# Patient Record
Sex: Female | Born: 2000 | Race: White | Hispanic: No | Marital: Single | Smoking: Never smoker
Health system: Southern US, Community
[De-identification: ages and names within clinical notes are randomized; demographics above are authoritative.]

## PROBLEM LIST (undated history)

## (undated) DIAGNOSIS — F419 Anxiety disorder, unspecified: Secondary | ICD-10-CM

## (undated) DIAGNOSIS — J020 Streptococcal pharyngitis: Secondary | ICD-10-CM

---

## 2019-01-03 ENCOUNTER — Other Ambulatory Visit: Payer: Self-pay

## 2019-01-03 ENCOUNTER — Emergency Department: Payer: BLUE CROSS/BLUE SHIELD

## 2019-01-03 ENCOUNTER — Emergency Department
Admission: EM | Admit: 2019-01-03 | Discharge: 2019-01-03 | Discharge status: Against Medical Advice | Payer: BLUE CROSS/BLUE SHIELD | Attending: Emergency Medicine | Admitting: Emergency Medicine

## 2019-01-03 DIAGNOSIS — Z5321 Procedure and treatment not carried out due to patient leaving prior to being seen by health care provider: Secondary | ICD-10-CM | POA: Diagnosis not present

## 2019-01-03 DIAGNOSIS — R0602 Shortness of breath: Secondary | ICD-10-CM | POA: Insufficient documentation

## 2019-01-03 NOTE — ED Triage Notes (Signed)
Patient reports feeling short of breath and like throat constricting.  Patient is able to speak in complete sentences at this time.

## 2019-01-03 NOTE — ED Notes (Signed)
Patient sitting in lobby in no acute distress.  

## 2019-01-03 NOTE — ED Notes (Signed)
Visitor to stat desk asking about wait time. Visitor given update on wait time. Visitor verbalizes understanding.  

## 2020-09-19 ENCOUNTER — Ambulatory Visit
Admission: EM | Admit: 2020-09-19 | Discharge: 2020-09-19 | Disposition: A | Discharge status: Home / Self Care | Payer: BC Managed Care – PPO | Attending: Family Medicine | Admitting: Family Medicine

## 2020-09-19 DIAGNOSIS — R519 Headache, unspecified: Secondary | ICD-10-CM | POA: Diagnosis present

## 2020-09-19 DIAGNOSIS — J029 Acute pharyngitis, unspecified: Secondary | ICD-10-CM | POA: Insufficient documentation

## 2020-09-19 DIAGNOSIS — J039 Acute tonsillitis, unspecified: Secondary | ICD-10-CM | POA: Diagnosis present

## 2020-09-19 DIAGNOSIS — R42 Dizziness and giddiness: Secondary | ICD-10-CM | POA: Diagnosis present

## 2020-09-19 DIAGNOSIS — Z1152 Encounter for screening for COVID-19: Secondary | ICD-10-CM | POA: Diagnosis present

## 2020-09-19 DIAGNOSIS — F411 Generalized anxiety disorder: Secondary | ICD-10-CM | POA: Insufficient documentation

## 2020-09-19 DIAGNOSIS — B349 Viral infection, unspecified: Secondary | ICD-10-CM | POA: Diagnosis present

## 2020-09-19 DIAGNOSIS — R509 Fever, unspecified: Secondary | ICD-10-CM | POA: Insufficient documentation

## 2020-09-19 DIAGNOSIS — R0981 Nasal congestion: Secondary | ICD-10-CM | POA: Diagnosis present

## 2020-09-19 HISTORY — DX: Anxiety disorder, unspecified: F41.9

## 2020-09-19 HISTORY — DX: Streptococcal pharyngitis: J02.0

## 2020-09-19 LAB — POCT RAPID STREP A (OFFICE): Rapid Strep A Screen: NEGATIVE

## 2020-09-19 MED ORDER — AZITHROMYCIN 250 MG PO TABS: 250.0000 mg | ORAL_TABLET | Freq: Every day | ORAL | 0 refills | Status: AC

## 2020-09-19 MED ORDER — HYDROXYZINE HCL 25 MG PO TABS: 25.0000 mg | ORAL_TABLET | Freq: Four times a day (QID) | ORAL | 0 refills | Status: AC

## 2020-09-19 NOTE — ED Provider Notes (Signed)
[facility]   [id number] 09/19/20 Arrival Time: 1043  PZ:WCHE THROAT  SUBJECTIVE: History from: patient.  Christina Acosta is a 19 y.o. female who presents with abrupt onset of sore throat, headache, nasal congestion, fever for the last day.  Denies sick exposure to Covid, strep, flu or mono, or precipitating event.  Has not taken OTC meds for this. Has positive history of Covid. Has completed Covid vaccines. Symptoms are made worse with swallowing, but tolerating liquids and own secretions without difficulty.  Denies previous symptoms in the past.    Also reports that she has been experiencing more anxiety than usual.  She reports that she was at a music festival this past weekend, and has had issues controlling her panic attacks since then.  Reports that she typically takes Xanax for this and cannot find hers at the moment, but has been taking her Pristiq.  Denies  ear pain, sinus pain, rhinorrhea, cough, SOB, wheezing, chest pain, nausea, rash, changes in bowel or bladder habits.    ROS: As per HPI.  All other pertinent ROS negative.     Past Medical History:  Diagnosis Date  . Anxiety   . Strep throat    History reviewed. No pertinent surgical history. Allergies  Allergen Reactions  . Amoxicillin Hives  . Penicillins Hives   No current facility-administered medications on file prior to encounter.   Current Outpatient Medications on File Prior to Encounter  Medication Sig Dispense Refill  . ALPRAZolam (XANAX) 0.5 MG tablet Take 0.5 mg by mouth at bedtime as needed for anxiety.    Marland Kitchen desvenlafaxine (PRISTIQ) 100 MG 24 hr tablet Take 100 mg by mouth daily.    . methylphenidate 36 MG PO CR tablet Take 36 mg by mouth daily.     Social History   Socioeconomic History  . Marital status: Single    Spouse name: Not on file  . Number of children: Not on file  . Years of education: Not on file  . Highest education level: Not on file  Occupational History  . Not on  file  Tobacco Use  . Smoking status: Never Smoker  . Smokeless tobacco: Never Used  Vaping Use  . Vaping Use: Never used  Substance and Sexual Activity  . Alcohol use: Yes    Comment: socially   . Drug use: Yes    Frequency: 2.0 times per week    Types: Marijuana  . Sexual activity: Not on file  Other Topics Concern  . Not on file  Social History Narrative  . Not on file   Social Determinants of Health   Financial Resource Strain:   . Difficulty of Paying Living Expenses: Not on file  Food Insecurity:   . Worried About Programme researcher, broadcasting/film/video in the Last Year: Not on file  . Ran Out of Food in the Last Year: Not on file  Transportation Needs:   . Lack of Transportation (Medical): Not on file  . Lack of Transportation (Non-Medical): Not on file  Physical Activity:   . Days of Exercise per Week: Not on file  . Minutes of Exercise per Session: Not on file  Stress:   . Feeling of Stress : Not on file  Social Connections:   . Frequency of Communication with Friends and Family: Not on file  . Frequency of Social Gatherings with Friends and Family: Not on file  . Attends Religious Services: Not on file  . Active Member of Clubs or Organizations: Not on  file  . Attends Banker Meetings: Not on file  . Marital Status: Not on file  Intimate Partner Violence:   . Fear of Current or Ex-Partner: Not on file  . Emotionally Abused: Not on file  . Physically Abused: Not on file  . Sexually Abused: Not on file   Family History  Problem Relation Age of Onset  . Healthy Mother   . Healthy Father     OBJECTIVE:  Vitals:   09/19/20 1124  BP: 125/82  Pulse: (!) 107  Resp: 18  Temp: 99.5 F (37.5 C)  TempSrc: Oral  SpO2: 95%     General appearance: alert; appears fatigued, but nontoxic, speaking in full sentences and managing own secretions HEENT: NCAT; Ears: EACs clear, TMs pearly gray with visible cone of light, without erythema; Eyes: PERRL, EOMI grossly; Nose:  no obvious rhinorrhea; Throat: oropharynx erythematous, tonsils 1+ and  erythematous with white tonsillar exudates, uvula midline Neck: supple without LAD Lungs: CTA bilaterally without adventitious breath sounds; cough absent Heart: regular rate and rhythm.  Radial pulses 2+ symmetrical bilaterally Skin: warm and dry Psychological: alert and cooperative; normal mood and affect  LABS: No results found for this or any previous visit (from the past 24 hour(s)).   ASSESSMENT & PLAN:  1. Acute tonsillitis, unspecified etiology   2. Sore throat   3. Encounter for screening for COVID-19   4. Dizziness and giddiness   5. Fever, unspecified fever cause   6. Nonintractable headache, unspecified chronicity pattern, unspecified headache type   7. Nasal congestion   8. Viral illness   9. Anxiety state     Prescribed hydroxyzine for anxiety that you may use 3 times a day as needed Prescribed Z-Pak for tonsillitis Continue supportive care at home Strep test negative, will send out for culture and we will call you with results Declines test for mono at this time Get plenty of rest and push fluids Take OTC Zyrtec and use chloraseptic spray as needed for throat pain. Drink warm or cool liquids, use throat lozenges, or popsicles to help alleviate symptoms Take OTC ibuprofen or tylenol as needed for pain Follow up with PCP if symptoms persists Return or go to ER if patient has any new or worsening symptoms such as fever, chills, nausea, vomiting, worsening sore throat, cough, abdominal pain, chest pain, changes in bowel or bladder habits  Reviewed expectations re: course of current medical issues. Questions answered. Outlined signs and symptoms indicating need for more acute intervention. Patient verbalized understanding. After Visit Summary given.          Moshe Cipro, NP 09/19/20 1158

## 2020-09-19 NOTE — ED Triage Notes (Signed)
Pt reports feeling dizzy/lightheaded and chest tightness last week.  Unsure if it was r/t to her anxiety.  Started having fever, HA, sore throat, body pains since yesterday.  Her roommates had sinus infections last week.  Is supposed to have tonsillectomy but keeps being delayed d/t COVID.   States she is currently very anxious and states the anxiety is always worse when she is sick.  The current chest pain is typical for her anxiety.    Took Dayquil this morning.   Fully vaccinated.

## 2020-09-19 NOTE — Discharge Instructions (Addendum)
Your rapid strep test is negative.  I am going to go ahead and treat you for tonsillitis today.  I have sent in a Z-Pak for you to take.  Take 2 tablets today, 1 tablet daily for the next 4 days.  I have also sent in hydroxyzine for you to take 3 times a day as needed for anxiety  Your COVID test is pending.  You should self quarantine until the test result is back.    Take Tylenol as needed for fever or discomfort.  Rest and keep yourself hydrated.    Go to the emergency department if you develop acute worsening symptoms.

## 2020-09-21 LAB — CULTURE, GROUP A STREP (THRC)

## 2020-09-21 LAB — SARS-COV-2, NAA 2 DAY TAT

## 2020-09-21 LAB — NOVEL CORONAVIRUS, NAA: SARS-CoV-2, NAA: NOT DETECTED

## 2020-10-19 ENCOUNTER — Ambulatory Visit
Admission: EM | Admit: 2020-10-19 | Discharge: 2020-10-19 | Disposition: A | Discharge status: Home / Self Care | Payer: BC Managed Care – PPO | Attending: Emergency Medicine | Admitting: Emergency Medicine

## 2020-10-19 DIAGNOSIS — B349 Viral infection, unspecified: Secondary | ICD-10-CM | POA: Diagnosis present

## 2020-10-19 DIAGNOSIS — Z1152 Encounter for screening for COVID-19: Secondary | ICD-10-CM | POA: Diagnosis present

## 2020-10-19 LAB — POCT RAPID STREP A (OFFICE): Rapid Strep A Screen: NEGATIVE

## 2020-10-19 NOTE — Discharge Instructions (Addendum)
Your rapid strep test is negative.  A throat culture is pending; we will call you if it is positive requiring treatment.    Your COVID test is pending.  You should self quarantine until the test result is back.    Take Tylenol as needed for fever or discomfort.  Rest and keep yourself hydrated.    Go to the emergency department if you develop acute worsening symptoms.     

## 2020-10-19 NOTE — ED Provider Notes (Signed)
[facility]    CSN: [id number] Arrival date & time: 10/19/20  1236      History   Chief Complaint Chief Complaint  Patient presents with  . Sore Throat    HPI Christina Acosta is a 19 y.o. female.   Patient presents with 1 day history of sore throat, fever, nasal congestion, fatigue, headache.  T-max 100.  Treatment at home with ibuprofen and NyQuil.  She denies rash, cough, shortness of breath, vomiting, diarrhea, or other symptoms.  Patient was seen here on 09/19/2020 by NP Ashley Royalty; diagnosed with acute tonsillitis, sore throat, dizziness, fever, headache, nasal congestion, viral illness, anxiety; treated with hydroxyzine and Zithromax.  The history is provided by the patient and medical records.    Past Medical History:  Diagnosis Date  . Anxiety   . Strep throat     There are no problems to display for this patient.   History reviewed. No pertinent surgical history.  OB History   No obstetric history on file.      Home Medications    Prior to Admission medications   Medication Sig Start Date End Date Taking? Authorizing Provider  ALPRAZolam Prudy Feeler) 0.5 MG tablet Take 0.5 mg by mouth at bedtime as needed for anxiety.    [provider]  azithromycin (ZITHROMAX) 250 MG tablet Take 1 tablet (250 mg total) by mouth daily. Take first 2 tablets together, then 1 every day until finished. 09/19/20   Moshe Cipro, NP  desvenlafaxine (PRISTIQ) 100 MG 24 hr tablet Take 100 mg by mouth daily.    [provider]  hydrOXYzine (ATARAX/VISTARIL) 25 MG tablet Take 1 tablet (25 mg total) by mouth every 6 (six) hours. 09/19/20   Moshe Cipro, NP  methylphenidate 36 MG PO CR tablet Take 36 mg by mouth daily.    [provider]    Family History Family History  Problem Relation Age of Onset  . Healthy Mother   . Healthy Father     Social History Social History   Tobacco Use  . Smoking status: Never Smoker  . Smokeless  tobacco: Never Used  Vaping Use  . Vaping Use: Never used  Substance Use Topics  . Alcohol use: Yes    Comment: socially   . Drug use: Yes    Frequency: 2.0 times per week    Types: Marijuana     Allergies   Amoxicillin and Penicillins   Review of Systems Review of Systems  Constitutional: Positive for fatigue and fever. Negative for chills.  HENT: Positive for congestion and sore throat. Negative for ear pain.   Eyes: Negative for pain and visual disturbance.  Respiratory: Negative for cough and shortness of breath.   Cardiovascular: Negative for chest pain and palpitations.  Gastrointestinal: Negative for abdominal pain, diarrhea and vomiting.  Genitourinary: Negative for dysuria and hematuria.  Musculoskeletal: Negative for arthralgias and back pain.  Skin: Negative for color change and rash.  Neurological: Positive for headaches. Negative for dizziness, seizures, syncope, facial asymmetry, speech difficulty, weakness and numbness.  All other systems reviewed and are negative.    Physical Exam Triage Vital Signs ED Triage Vitals [10/19/20 1312]  Enc Vitals Group     BP 132/82     Pulse Rate 89     Resp 16     Temp 98.9 F (37.2 C)     Temp Source Oral     SpO2 97 %     Weight 200 lb (90.7 kg)  Height 5\' 10"  (1.778 m)     Head Circumference      Peak Flow      Pain Score 4     Pain Loc      Pain Edu?      Excl. in GC?    No data found.  Updated Vital Signs BP 132/82   Pulse 89   Temp 98.9 F (37.2 C) (Oral)   Resp 16   Ht 5\' 10"  (1.778 m)   Wt 200 lb (90.7 kg)   LMP 10/11/2020   SpO2 97%   BMI 28.70 kg/m   Visual Acuity Right Eye Distance:   Left Eye Distance:   Bilateral Distance:    Right Eye Near:   Left Eye Near:    Bilateral Near:     Physical Exam Vitals and nursing note reviewed.  Constitutional:      General: She is not in acute distress.    Appearance: She is well-developed.  HENT:     Head: Normocephalic and atraumatic.       Right Ear: Tympanic membrane normal.     Left Ear: Tympanic membrane normal.     Nose: Congestion present.     Mouth/Throat:     Mouth: Mucous membranes are moist.     Pharynx: Oropharynx is clear.  Eyes:     Conjunctiva/sclera: Conjunctivae normal.  Cardiovascular:     Rate and Rhythm: Normal rate and regular rhythm.     Heart sounds: No murmur heard.   Pulmonary:     Effort: Pulmonary effort is normal. No respiratory distress.     Breath sounds: Normal breath sounds. No wheezing or rhonchi.  Abdominal:     Palpations: Abdomen is soft.     Tenderness: There is no abdominal tenderness. There is no guarding or rebound.  Musculoskeletal:     Cervical back: Neck supple.  Skin:    General: Skin is warm and dry.     Findings: No rash.  Neurological:     General: No focal deficit present.     Mental Status: She is alert and oriented to person, place, and time.     Gait: Gait normal.  Psychiatric:        Mood and Affect: Mood normal.        Behavior: Behavior normal.      UC Treatments / Results  Labs (all labs ordered are listed, but only abnormal results are displayed) Labs Reviewed  NOVEL CORONAVIRUS, NAA  CULTURE, GROUP A STREP [facility])  POCT RAPID STREP A (OFFICE)    EKG   Radiology No results found.  Procedures Procedures (including critical care time)  Medications Ordered in UC Medications - No data to display  Initial Impression / Assessment and Plan / UC Course  I have reviewed the triage vital signs and the nursing notes.  Pertinent labs & imaging results that were available during my care of the patient were reviewed by me and considered in my medical decision making (see chart for details).   Viral illness.  Antibiotic stewardship discussed.  Rapid strep negative; culture pending.  PCR COVID pending.  Instructed patient to self quarantine until the test result is back.  Discussed symptomatic treatment including Tylenol, rest, hydration.  Instructed  patient to follow up with PCP if her symptoms are not improving  Patient agrees to plan of care.    Final Clinical Impressions(s) / UC Diagnoses   Final diagnoses:  Encounter for screening for COVID-19  Viral illness  Discharge Instructions     Your rapid strep test is negative.  A throat culture is pending; we will call you if it is positive requiring treatment.    Your COVID test is pending.  You should self quarantine until the test result is back.    Take Tylenol as needed for fever or discomfort.  Rest and keep yourself hydrated.    Go to the emergency department if you develop acute worsening symptoms.        ED Prescriptions    None     PDMP not reviewed this encounter.   Mickie Bail, NP 10/19/20 1336

## 2020-10-19 NOTE — ED Triage Notes (Signed)
Pt reports sore throat, fever, congestion, fatigue and headache that began yesterday. Pt sts she got the flu vaccine on Tuesday and thought these symptoms were from the vaccine. sts she has had recurrent tonsillitis and this feels the same.

## 2020-10-20 LAB — NOVEL CORONAVIRUS, NAA: SARS-CoV-2, NAA: NOT DETECTED

## 2020-10-20 LAB — SARS-COV-2, NAA 2 DAY TAT

## 2020-10-21 LAB — CULTURE, GROUP A STREP (THRC)

## 2021-09-03 ENCOUNTER — Emergency Department
Admission: EM | Admit: 2021-09-03 | Discharge: 2021-09-03 | Discharge status: Against Medical Advice | Payer: BC Managed Care – PPO

## 2022-03-12 ENCOUNTER — Other Ambulatory Visit: Payer: Self-pay | Admitting: Orthopedic Surgery

## 2022-03-12 ENCOUNTER — Other Ambulatory Visit (HOSPITAL_COMMUNITY): Payer: Self-pay | Admitting: Orthopedic Surgery

## 2022-03-12 DIAGNOSIS — Z9889 Other specified postprocedural states: Secondary | ICD-10-CM

## 2022-03-15 ENCOUNTER — Ambulatory Visit
Admission: RE | Admit: 2022-03-15 | Discharge: 2022-03-15 | Disposition: A | Discharge status: Home / Self Care | Payer: BLUE CROSS/BLUE SHIELD | Source: Ambulatory Visit | Attending: Orthopedic Surgery | Admitting: Orthopedic Surgery

## 2022-03-15 DIAGNOSIS — Z9889 Other specified postprocedural states: Secondary | ICD-10-CM | POA: Insufficient documentation

## 2023-01-13 IMAGING — MR MR KNEE*L* W/O CM
7 series · 40 of 40 positions shown · non-contrast
Comparison: None.

CLINICAL DATA: Generalized knee pain, painful bump on lateral
aspect of knee since surgery

EXAM:
MRI OF THE LEFT KNEE WITHOUT CONTRAST
TECHNIQUE: Multiplanar, multisequence MR imaging of the knee was performed. No
intravenous contrast was administered.

[Series 9: T2 fat-sat · coronal · left · 4.0mm · 0.62mm/px · 6 of 30 slices shown (1 of 3)]
[im 1/30]
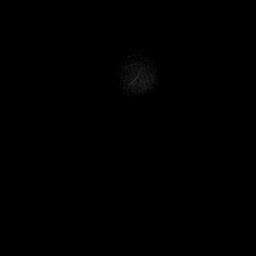
[im 6/30]
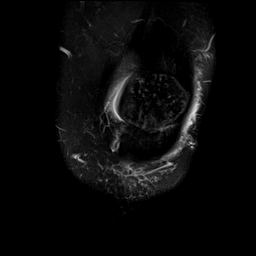
[im 12/30]
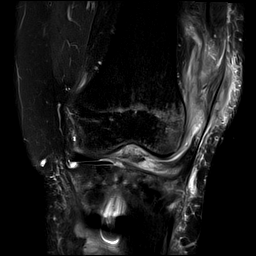
[im 18/30]
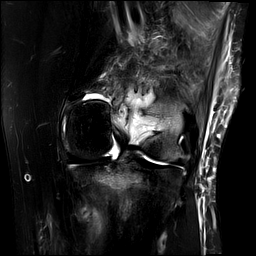
[im 24/30]
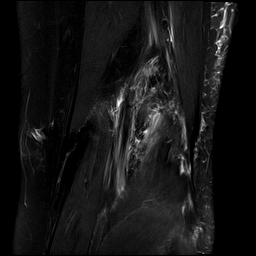
[im 30/30]
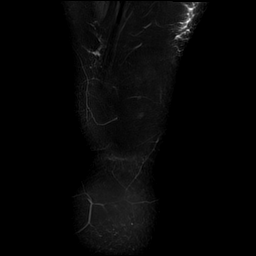

[Series 10: T1 · coronal · left · 4.0mm · 0.62mm/px · 6 of 30 slices shown]
[im 1/30]
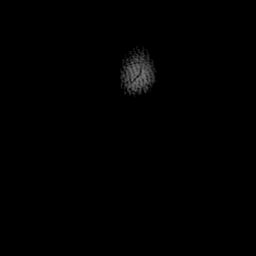
[im 6/30]
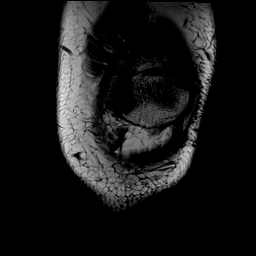
[im 12/30]
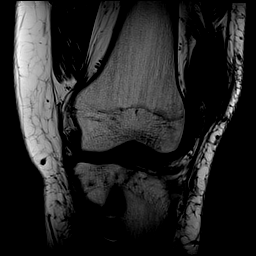
[im 18/30]
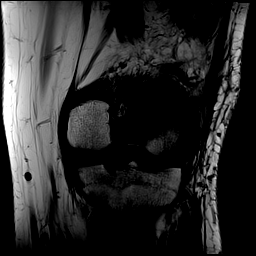
[im 24/30]
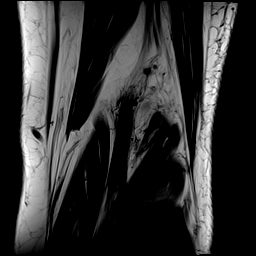
[im 30/30]
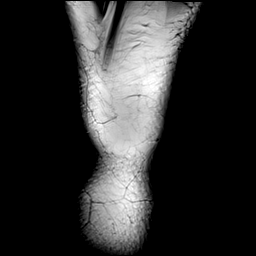

[Series 11: PD fat-sat · coronal · left · 4.0mm · 0.62mm/px · 5 of 29 slices shown (1 of 2)]
[im 1/29]
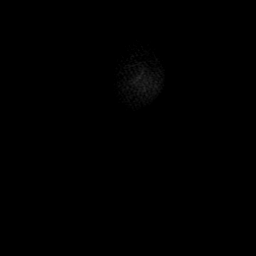
[im 8/29]
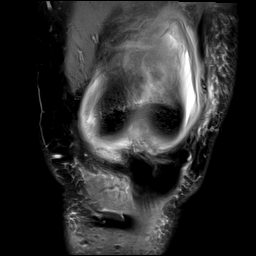
[im 15/29]
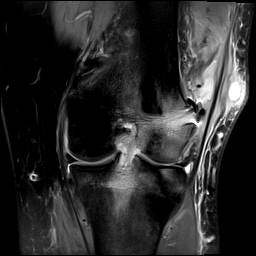
[im 22/29]
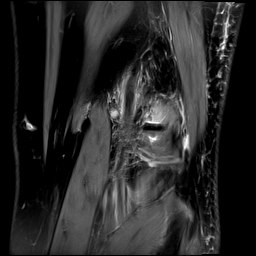
[im 29/29]
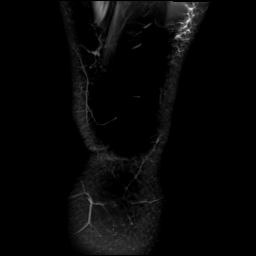

[Series 12: PD fat-sat · sagittal · left · 3.0mm · 0.62mm/px · 7 of 39 slices shown (2 of 2)]
[im 1/39]
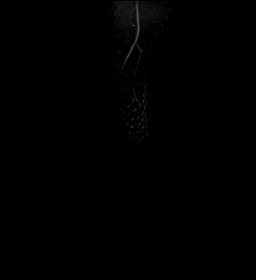
[im 7/39]
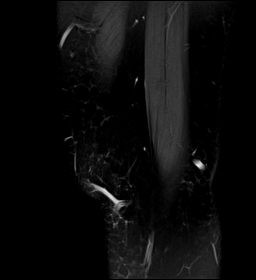
[im 13/39]
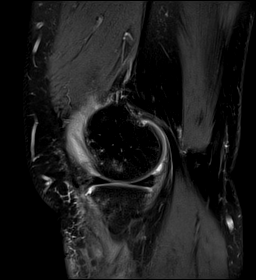
[im 20/39]
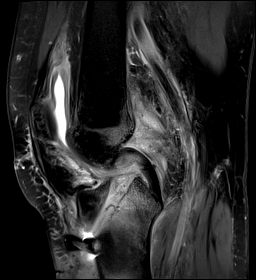
[im 26/39]
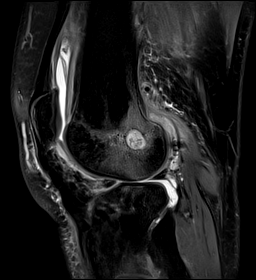
[im 32/39]
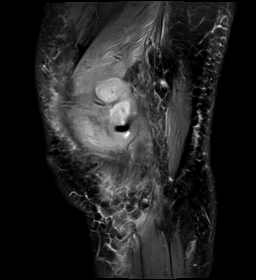
[im 39/39]
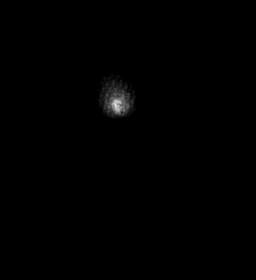

[Series 13: T2 fat-sat · sagittal · left · 3.0mm · 0.62mm/px · 7 of 40 slices shown (2 of 3)]
[im 1/40]
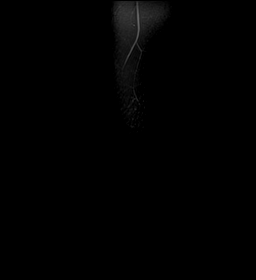
[im 7/40]
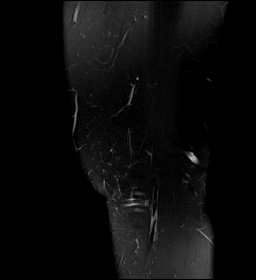
[im 14/40]
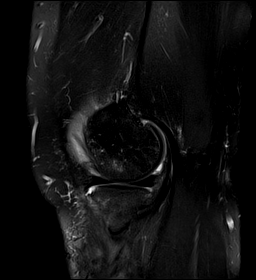
[im 20/40]
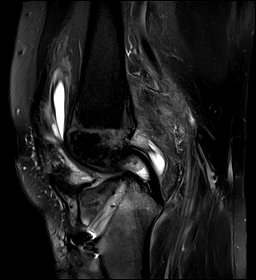
[im 27/40]
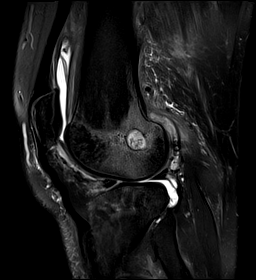
[im 33/40]
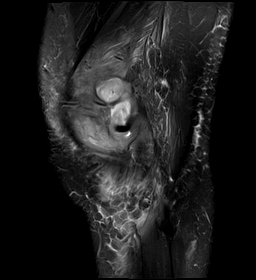
[im 40/40]
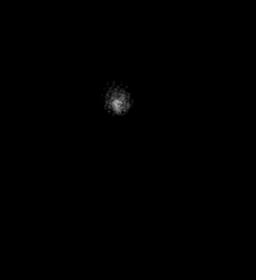

[Series 14: PD · coronal · left · 2.0mm · 0.47mm/px · 3 of 16 slices shown]
[im 1/16]
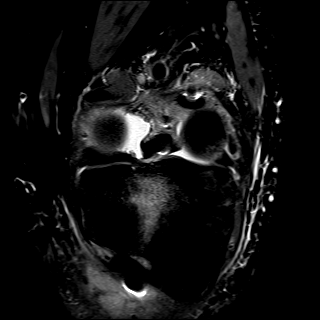
[im 8/16]
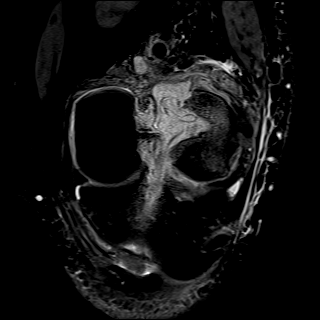
[im 16/16]
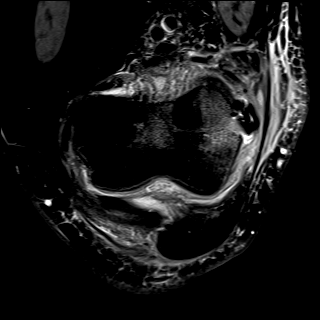

[Series 15: T2 fat-sat · axial · left · 4.0mm · 0.50mm/px · z∈[-87,+58]mm · 6 of 30 slices shown (3 of 3)]
[im 1/30]
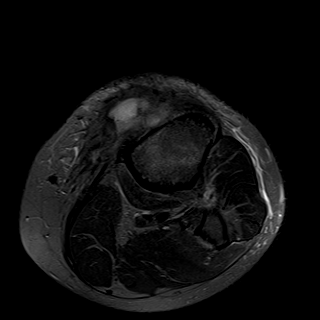
[im 6/30]
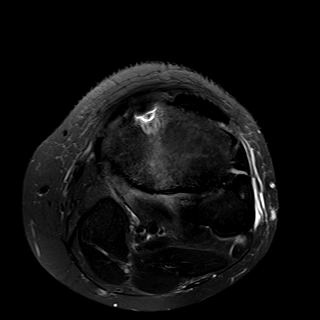
[im 12/30]
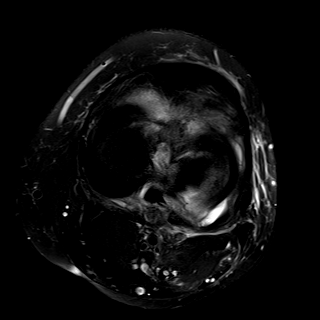
[im 18/30]
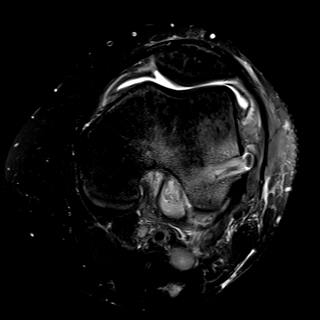
[im 24/30]
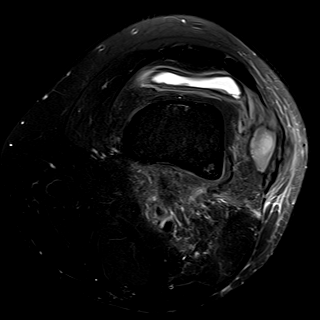
[im 30/30]
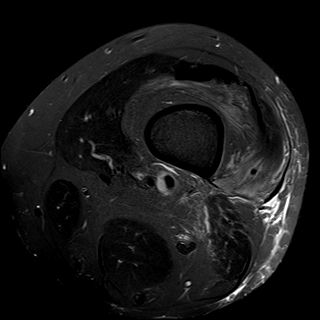

[40 of 40 positions shown; findings below may reference images not displayed]

FINDINGS: MENISCI

Medial: Intact.

Lateral: Intact.

LIGAMENTS

Cruciates: Prior ACL reconstruction with intact ACL graft. The
femoral tunnel appears widened near the intercondylar notch
measuring up to 1.4 cm. There is increased heterogeneous signal
within the femoral tunnel. There is a complex, heterogeneous fluid
collection which appears to extend from the endo-button at the
femoral condyle, through the IT band, and into the subcutaneous
tissues. This collection measures approximately 3.0 x 2.0 x 3.1 cm
(axial T2 image 10, coronal T2 image 16). There is also similar
appearing fluid collecting along the posterior aspect of the ACL
graft at the level of the intercondylar notch posteriorly measuring
1.3 x 0.8 x 1.4 cm (axial T2 image 13, sagittal PD image 23).
Adjacent to the tibial tunnel exit/endo-button there is a
heterogeneous fluid collection measuring 1.8 x 2.1 x 1.8 cm disease
axial T2 image 30, coronal T2 image 20).

The PCL is intact.

Collaterals: Medial collateral ligament is intact. Lateral
collateral ligament complex is intact.

CARTILAGE

Patellofemoral:  No chondral defect.

Medial:  No chondral defect.

Lateral: There is focal partial-thickness cartilage loss along the
weight-bearing surfaces.

JOINT: Small joint effusion which appears as simple fluid, as
opposed to the aforementioned collections which appear heterogeneous
as described above.

POPLITEAL FOSSA: Miniscule Baker cyst.

EXTENSOR MECHANISM: Intact quadriceps tendon. Intact patellar
tendon.

BONES: There is marrow edema most confluent in the lateral femoral
condyle and tibial plateaus.

Other: Fluid collections as described above.
IMPRESSION: Prior ACL reconstruction with intact appearing ACL graft.

Heterogeneous fluid collection extending from the femoral
endo-button, through the IT band, and into the subcutaneous tissues
measuring approximately 3.0 x 2.0 x 3.1 cm. Similar appearing fluid
collecting along the posterior aspect of the ACL graft posteriorly
in the intercondylar notch and adjacent to the tibial endo-button.
These are favored to represent hematomas given recent surgery
possibly mucoid material, however infection cannot be completely
excluded by MRI. Recommend fluid aspiration for analysis.

Mild widening of the femoral tunnel near the intercondylar notch up
to 1.4 cm. Increased heterogeneous signal within the femoral and
tibial tunnel, which appears similar to the aforementioned fluid
collections, favored to be blood products or mucoid material,
infectious process not excluded as discussed above.

Marrow edema most confluent in the medial femoral condyle and tibial
plateaus, favored to reactive related to recent surgery, however
developing infection is possible as discussed above.

Small joint effusion which appears as simple fluid as opposed to the
heterogeneous collections as discussed above.
